# Patient Record
Sex: Male | Born: 2000 | Race: White | Hispanic: No | Marital: Single | Smoking: Never smoker
Health system: Southern US, Community
[De-identification: ages and names within clinical notes are randomized; demographics above are authoritative.]

## PROBLEM LIST (undated history)

## (undated) DIAGNOSIS — F909 Attention-deficit hyperactivity disorder, unspecified type: Secondary | ICD-10-CM

---
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
## 2020-07-07 ENCOUNTER — Emergency Department (HOSPITAL_COMMUNITY)
Admission: EM | Admit: 2020-07-07 | Discharge: 2020-07-07 | Disposition: A | Discharge status: Home / Self Care | Payer: Medicaid Other | Attending: Emergency Medicine | Admitting: Emergency Medicine

## 2020-07-07 ENCOUNTER — Emergency Department (HOSPITAL_COMMUNITY): Payer: Medicaid Other

## 2020-07-07 ENCOUNTER — Encounter (HOSPITAL_COMMUNITY): Payer: Self-pay

## 2020-07-07 ENCOUNTER — Other Ambulatory Visit: Payer: Self-pay

## 2020-07-07 DIAGNOSIS — Y9367 Activity, basketball: Secondary | ICD-10-CM | POA: Diagnosis not present

## 2020-07-07 DIAGNOSIS — S43014A Anterior dislocation of right humerus, initial encounter: Secondary | ICD-10-CM | POA: Insufficient documentation

## 2020-07-07 DIAGNOSIS — Y92838 Other recreation area as the place of occurrence of the external cause: Secondary | ICD-10-CM | POA: Insufficient documentation

## 2020-07-07 DIAGNOSIS — W2105XA Struck by basketball, initial encounter: Secondary | ICD-10-CM | POA: Diagnosis not present

## 2020-07-07 DIAGNOSIS — S4991XA Unspecified injury of right shoulder and upper arm, initial encounter: Secondary | ICD-10-CM | POA: Diagnosis present

## 2020-07-07 DIAGNOSIS — S43004A Unspecified dislocation of right shoulder joint, initial encounter: Secondary | ICD-10-CM

## 2020-07-07 MED ORDER — IBUPROFEN 800 MG PO TABS
800.0000 mg | ORAL_TABLET | Freq: Once | ORAL | Status: AC
  Administered 2020-07-07: 800 mg via ORAL
  Filled 2020-07-07: qty 1

## 2020-07-07 MED ORDER — NAPROXEN 500 MG PO TABS: 500.0000 mg | ORAL_TABLET | Freq: Two times a day (BID) | ORAL | 0 refills | Status: AC

## 2020-07-07 NOTE — Progress Notes (Signed)
Orthopedic Tech Progress Note Patient Details:  Jesus Newton 07/06/2000 [id number]  Ortho Devices Ortho Device/Splint Location: applied shoulder sling to RUE Ortho Device/Splint Interventions: Ordered,Application   Post Interventions Patient Tolerated: Well Instructions Provided: Care of device   Jennye Moccasin 07/07/2020, 9:28 PM

## 2020-07-07 NOTE — Discharge Instructions (Addendum)
Please take Naprosyn, 500mg  by mouth twice daily as needed for pain - this in an antiinflammatory medicine (NSAID) and is similar to ibuprofen - many people feel that it is stronger than ibuprofen and it is easier to take since it is a smaller pill.  Please use this only for 1 week - if your pain persists, you will need to follow up with your doctor in the office for ongoing guidance and pain control.     Your shoulder dislocated and has been put back in place without difficulty - this may happen again in the future so it would be well for you to follow up with the orthopedist of your choice - if you do not have one see the phone number above for Dr. who is on call today.

## 2020-07-07 NOTE — ED Triage Notes (Signed)
Right shoulder pain after dunking a basketball [place]ampoline park. Limited ROM.
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
## 2020-08-06 ENCOUNTER — Ambulatory Visit (HOSPITAL_BASED_OUTPATIENT_CLINIC_OR_DEPARTMENT_OTHER): Admission: RE | Admit: 2020-08-06 | Payer: Medicaid Other | Source: Home / Self Care | Admitting: Orthopedic Surgery

## 2020-08-06 ENCOUNTER — Encounter (HOSPITAL_BASED_OUTPATIENT_CLINIC_OR_DEPARTMENT_OTHER): Admission: RE | Payer: Self-pay | Source: Home / Self Care

## 2020-08-06 SURGERY — SHOULDER ATHROSCOPY WITH CAPSULORRHAPHY
Anesthesia: General | Site: Shoulder | Laterality: Right
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
## 2022-12-23 ENCOUNTER — Ambulatory Visit
Admission: RE | Admit: 2022-12-23 | Discharge: 2022-12-23 | Disposition: A | Discharge status: Home / Self Care | Payer: MEDICAID | Source: Ambulatory Visit | Attending: Internal Medicine | Admitting: Internal Medicine

## 2022-12-23 VITALS — BP 128/63 | HR 62 | Temp 97.9°F | Resp 16

## 2022-12-23 DIAGNOSIS — Z202 Contact with and (suspected) exposure to infections with a predominantly sexual mode of transmission: Secondary | ICD-10-CM | POA: Insufficient documentation

## 2022-12-23 DIAGNOSIS — Z113 Encounter for screening for infections with a predominantly sexual mode of transmission: Secondary | ICD-10-CM | POA: Insufficient documentation

## 2022-12-23 MED ORDER — AZITHROMYCIN 500 MG PO TABS
2000.0000 mg | ORAL_TABLET | Freq: Once | ORAL | Status: AC
  Administered 2022-12-23: 2000 mg via ORAL

## 2022-12-23 MED ORDER — GENTAMICIN SULFATE 40 MG/ML IJ SOLN
240.0000 mg | Freq: Once | INTRAMUSCULAR | Status: AC
  Administered 2022-12-23: 240 mg via INTRAMUSCULAR

## 2022-12-23 NOTE — Discharge Instructions (Signed)
Swab is pending for STD testing.  You were given 2 medications today in urgent care to treat gonorrhea exposure.

## 2022-12-23 NOTE — ED Triage Notes (Signed)
Pt states he is here for STD testing denies any symptoms.

## 2022-12-23 NOTE — ED Provider Notes (Signed)
[name]    CSN: [id number] Arrival date & time: 12/23/22  1847      History   Chief Complaint Chief Complaint  Patient presents with   SEXUALLY TRANSMITTED DISEASE    my girl got tested and she came back wit postive wit gonorrhea - Entered by patient    HPI Jesus Newton is a 22 y.o. male.   Patient presents after gonorrhea exposure from his girlfriend after having unprotected intercourse.  He denies any associated symptoms.     Past Medical History:  Diagnosis Date   ADHD (attention deficit hyperactivity disorder)     There are no problems to display for this patient.   History reviewed. No pertinent surgical history.     Home Medications    Prior to Admission medications   Medication Sig Start Date End Date Taking? Authorizing Provider  azithromycin (ZITHROMAX Z-PAK) 250 MG tablet Take 2 tabs PO once on day 1.  On days 2-5, take 1 tab PO QD. 01/10/18   Lowanda Foster, NP  dexmethylphenidate (FOCALIN XR) 20 MG 24 hr capsule Take 20 mg by mouth 2 (two) times daily.    [provider]  guanFACINE (INTUNIV) 2 MG TB24 SR tablet Take 2 mg by mouth daily.    [provider]  naproxen (NAPROSYN) 500 MG tablet Take 1 tablet (500 mg total) by mouth 2 (two) times daily with a meal. 07/07/20   Eber Hong, MD    Family History History reviewed. No pertinent family history.  Social History Social History   Tobacco Use   Smoking status: Never   Smokeless tobacco: Never  Substance Use Topics   Alcohol use: No   Drug use: No     Allergies   Penicillins   Review of Systems Review of Systems Per HPI  Physical Exam Triage Vital Signs ED Triage Vitals  Encounter Vitals Group     BP 12/23/22 1854 128/63     Systolic BP Percentile --      Diastolic BP Percentile --      Pulse Rate 12/23/22 1853 62     Resp 12/23/22 1853 16     Temp 12/23/22 1853 97.9 F (36.6 C)     Temp Source 12/23/22 1853 Oral     SpO2 --       Weight --      Height --      Head Circumference --      Peak Flow --      Pain Score 12/23/22 1854 0     Pain Loc --      Pain Education --      Exclude from Growth Chart --    No data found.  Updated Vital Signs BP 128/63 (BP Location: Left Arm)   Pulse 62   Temp 97.9 F (36.6 C) (Oral)   Resp 16   Visual Acuity Right Eye Distance:   Left Eye Distance:   Bilateral Distance:    Right Eye Near:   Left Eye Near:    Bilateral Near:     Physical Exam Constitutional:      General: He is not in acute distress.    Appearance: Normal appearance. He is not toxic-appearing or diaphoretic.  HENT:     Head: Normocephalic and atraumatic.  Eyes:     Extraocular Movements: Extraocular movements intact.     Conjunctiva/sclera: Conjunctivae normal.  Pulmonary:     Effort: Pulmonary effort is normal.  Genitourinary:    Comments: Deferred  with shared decision making.  Self swab performed. Neurological:     General: No focal deficit present.     Mental Status: He is alert and oriented to person, place, and time. Mental status is at baseline.  Psychiatric:        Mood and Affect: Mood normal.        Behavior: Behavior normal.        Thought Content: Thought content normal.        Judgment: Judgment normal.      UC Treatments / Results  Labs (all labs ordered are listed, but only abnormal results are displayed) Labs Reviewed  CYTOLOGY, (ORAL, ANAL, URETHRAL) ANCILLARY ONLY    EKG   Radiology No results found.  Procedures Procedures (including critical care time)  Medications Ordered in UC Medications  azithromycin (ZITHROMAX) tablet 2,000 mg (2,000 mg Oral Given 12/23/22 1911)  gentamicin (GARAMYCIN) injection 240 mg (240 mg Intramuscular Given 12/23/22 1911)    Initial Impression / Assessment and Plan / UC Course  I have reviewed the triage vital signs and the nursing notes.  Pertinent labs & imaging results that were available during my care of the patient were  reviewed by me and considered in my medical decision making (see chart for details).     Given exposure to gonorrhea, will prophylactically treat today in urgent care.  Patient is allergic to penicillins and is not sure if he has ever had cephalosporins previously.  Therefore, will treat with alternative with azithromycin 2 g as a single dose and gentamicin IM injection today.  Cytology swab pending.  Will await results for any further treatment.  Advised safe sex practices and refraining from sexual activity until test results and treatment are complete.  Patient verbalized understanding and was agreeable with plan. Final Clinical Impressions(s) / UC Diagnoses   Final diagnoses:  Exposure to gonorrhea  Screening examination for venereal disease     Discharge Instructions      Swab is pending for STD testing.  You were given 2 medications today in urgent care to treat gonorrhea exposure.    ED Prescriptions   None    PDMP not reviewed this encounter.   Gustavus Bryant, [place] 12/23/22 [phone]

## 2022-12-24 ENCOUNTER — Telehealth: Payer: MEDICAID | Admitting: Physician Assistant

## 2022-12-24 DIAGNOSIS — Z202 Contact with and (suspected) exposure to infections with a predominantly sexual mode of transmission: Secondary | ICD-10-CM | POA: Diagnosis not present

## 2022-12-24 NOTE — Progress Notes (Signed)
Virtual Visit Consent   Jesus Newton, you are scheduled for a virtual visit with a [facility] provider today. Just as with appointments in the office, your consent must be obtained to participate. Your consent will be active for this visit and any virtual visit you may have with one of our providers in the next 365 days. If you have a MyChart account, a copy of this consent can be sent to you electronically.  As this is a virtual visit, video technology does not allow for your provider to perform a traditional examination. This may limit your provider's ability to fully assess your condition. If your provider identifies any concerns that need to be evaluated in person or the need to arrange testing (such as labs, EKG, etc.), we will make arrangements to do so. Although advances in technology are sophisticated, we cannot ensure that it will always work on either your end or our end. If the connection with a video visit is poor, the visit may have to be switched to a telephone visit. With either a video or telephone visit, we are not always able to ensure that we have a secure connection.  By engaging in this virtual visit, you consent to the provision of healthcare and authorize for your insurance to be billed (if applicable) for the services provided during this visit. Depending on your insurance coverage, you may receive a charge related to this service.  I need to obtain your verbal consent now. Are you willing to proceed with your visit today? [name] has provided verbal consent on 12/24/2022 for a virtual visit (video or telephone). [name], [place]  Date: 12/24/2022 4:59 PM  Virtual Visit via Video Note   I, [name], connected with  Torris Matsuoka  ([id number], [birth year]) on 12/24/22 at  5:00 PM EDT by a video-enabled telemedicine application and verified that I am speaking with the correct person using two identifiers.  Location: Patient: Virtual Visit Location  Patient: Home Provider: Virtual Visit Location Provider: Home Office   I discussed the limitations of evaluation and management by telemedicine and the availability of in person appointments. The patient expressed understanding and agreed to proceed.    History of Present Illness: Jesus Pittard is a 22 y.o. who identifies as a male who was assigned male at birth, and is being seen today for questions regarding treatment yesterday for gonorrhea exposure. Notes he was evaluated at [facility] and giving exposure was treated with IM Gentamicin and Oral Azithromycin. STI panel still pending. Notes he and his girlfriend who was the exposure, had intercourse this morning after he was treated yesterday but before her treatment that she received this afternoon. Is concerned about re-exposure.     HPI: HPI  Problems: There are no problems to display for this patient.   Allergies:  Allergies  Allergen Reactions   Penicillins Hives   Medications:  Current Outpatient Medications:    azithromycin (ZITHROMAX Z-PAK) 250 MG tablet, Take 2 tabs PO once on day 1.  On days 2-5, take 1 tab PO QD., Disp: 6 each, Rfl: 0   dexmethylphenidate (FOCALIN XR) 20 MG 24 hr capsule, Take 20 mg by mouth 2 (two) times daily., Disp: , Rfl:    guanFACINE (INTUNIV) 2 MG TB24 SR tablet, Take 2 mg by mouth daily., Disp: , Rfl:    naproxen (NAPROSYN) 500 MG tablet, Take 1 tablet (500 mg total) by mouth 2 (two) times daily with a meal., Disp: 30 tablet, Rfl: 0  Observations/Objective: Patient is well-developed, well-nourished in no acute distress.  Resting comfortably  at home.  Head is normocephalic, atraumatic.  No labored breathing.  Speech is clear and coherent with logical content.  Patient is alert and oriented at baseline.    Assessment and Plan: 1. STD exposure  Known gonorrhea exposure via significant other. Was treated yesterday with IM Gentamicin and Azithromycin. Repeat episode of activity today prior to partner  being fully treated. He remains asymptomatic. At this point discussed he needs to refrain from any sexual activity until he and girlfriend have been retested and treated again if necessary. UC to follow-up with him regarding initial labs to make sure no other STI present warranting additional treatments. Follow-up with UC recommendations for timing of retest.  Follow Up Instructions: I discussed the assessment and treatment plan with the patient. The patient was provided an opportunity to ask questions and all were answered. The patient agreed with the plan and demonstrated an understanding of the instructions.  A copy of instructions were sent to the patient via MyChart unless otherwise noted below.   The patient was advised to call back or seek an in-person evaluation if the symptoms worsen or if the condition fails to improve as anticipated.  Time:  I spent 10 minutes with the patient via telehealth technology discussing the above problems/concerns.    [name], PA-C

## 2022-12-24 NOTE — Patient Instructions (Signed)
  Jesus Newton, thank you for joining [name], PA-C for today's virtual visit.  While this provider is not your primary care provider (PCP), if your PCP is located in our provider database this encounter information will be shared with them immediately following your visit.   A [facility] MyChart account gives you access to today's visit and all your visits, tests, and labs performed at [facility] " click here if you don't have a [facility] MyChart account or go to [email or web address]  Consent: (Patient) Jesus Newton provided verbal consent for this virtual visit at the beginning of the encounter.  Current Medications:  Current Outpatient Medications:    azithromycin (ZITHROMAX Z-PAK) 250 MG tablet, Take 2 tabs PO once on day 1.  On days 2-5, take 1 tab PO QD., Disp: 6 each, Rfl: 0   dexmethylphenidate (FOCALIN XR) 20 MG 24 hr capsule, Take 20 mg by mouth 2 (two) times daily., Disp: , Rfl:    guanFACINE (INTUNIV) 2 MG TB24 SR tablet, Take 2 mg by mouth daily., Disp: , Rfl:    naproxen (NAPROSYN) 500 MG tablet, Take 1 tablet (500 mg total) by mouth 2 (two) times daily with a meal., Disp: 30 tablet, Rfl: 0   Medications ordered in this encounter:  No orders of the defined types were placed in this encounter.    *If you need refills on other medications prior to your next appointment, please contact your pharmacy*  Follow-Up: Call back or seek an in-person evaluation if the symptoms worsen or if the condition fails to improve as anticipated.  [facility] [phone]  Other Instructions   If you have been instructed to have an in-person evaluation today at a local Urgent Care facility, please use the link below. It will take you to a list of all of our available [facility], including address, phone number and hours of operation. Please do not delay care.  [facility]  If you or a family member do not have a  primary care provider, use the link below to schedule a visit and establish care. When you choose a [facility] primary care physician or advanced practice provider, you gain a long-term partner in health. Find a Primary Care Provider  Learn more about [facility]'s in-office and virtual care options: [facility] - Get Care Now

## 2022-12-30 ENCOUNTER — Ambulatory Visit: Payer: MEDICAID

## 2022-12-31 ENCOUNTER — Ambulatory Visit: Payer: MEDICAID

## 2023-09-07 ENCOUNTER — Ambulatory Visit
Admission: RE | Admit: 2023-09-07 | Discharge: 2023-09-07 | Disposition: A | Discharge status: Home / Self Care | Payer: MEDICAID | Source: Ambulatory Visit | Attending: Family Medicine | Admitting: Family Medicine

## 2023-09-07 ENCOUNTER — Ambulatory Visit: Payer: MEDICAID

## 2023-09-07 VITALS — BP 156/84 | HR 86 | Temp 98.2°F | Resp 16 | Wt 199.3 lb

## 2023-09-07 DIAGNOSIS — R509 Fever, unspecified: Secondary | ICD-10-CM | POA: Diagnosis not present

## 2023-09-07 DIAGNOSIS — J029 Acute pharyngitis, unspecified: Secondary | ICD-10-CM | POA: Diagnosis not present

## 2023-09-07 LAB — POCT RAPID STREP A (OFFICE): Rapid Strep A Screen: NEGATIVE

## 2023-09-07 MED ORDER — AZITHROMYCIN 250 MG PO TABS: ORAL_TABLET | ORAL | 0 refills | Status: DC

## 2023-09-07 NOTE — ED Triage Notes (Signed)
 Sore throat and been having fever sweating when I sleep throat pain and swollen lymph nodes - Entered by patient  Sxs x 4 days. Pt denies vomiting and cough.

## 2023-09-07 NOTE — ED Provider Notes (Signed)
 [name]    CSN: [id number] Arrival date & time: 09/07/23  1549      History   Chief Complaint Chief Complaint  Patient presents with   Sore Throat    and been having fever sweating when i sleep throat pain and swollen lymph nodes - Entered by patient    HPI Jesus Newton is a 23 y.o. male.    Sore Throat  Patient here today with 4 days of sore throat and fever.  He denies any symptoms of cough, congestion.  Endorses pain with swallowing.  He also has adenopathy along with white spots on his tonsils.  Reports he has been taking Tylenol with minimal relief of symptoms.  Past Medical History:  Diagnosis Date   ADHD (attention deficit hyperactivity disorder)     There are no active problems to display for this patient.   History reviewed. No pertinent surgical history.     Home Medications    Prior to Admission medications   Medication Sig Start Date End Date Taking? Authorizing Provider  azithromycin (ZITHROMAX) 250 MG tablet Take 2 tabs PO x 1 dose, then 1 tab PO QD x 4 days 09/07/23  Yes Buena Carmine, NP  dexmethylphenidate (FOCALIN XR) 20 MG 24 hr capsule Take 20 mg by mouth 2 (two) times daily.    [provider]  guanFACINE (INTUNIV) 2 MG TB24 SR tablet Take 2 mg by mouth daily.    [provider]  naproxen (NAPROSYN) 500 MG tablet Take 1 tablet (500 mg total) by mouth 2 (two) times daily with a meal. 07/07/20   Early Glisson, MD    Family History History reviewed. No pertinent family history.  Social History Social History   Tobacco Use   Smoking status: Never   Smokeless tobacco: Never  Vaping Use   Vaping status: Every Day   Substances: Nicotine, THC  Substance Use Topics   Alcohol use: No   Drug use: No     Allergies   Penicillins   Review of Systems Review of Systems Pertinent negatives listed in HPI   Physical Exam Triage Vital Signs ED Triage Vitals  Encounter Vitals Group     BP 09/07/23  1622 (!) 156/84     Systolic BP Percentile --      Diastolic BP Percentile --      Pulse Rate 09/07/23 1622 86     Resp 09/07/23 1622 16     Temp 09/07/23 1622 98.2 F (36.8 C)     Temp Source 09/07/23 1622 Oral     SpO2 09/07/23 1622 97 %     Weight 09/07/23 1620 199 lb 4.8 oz (90.4 kg)     Height --      Head Circumference --      Peak Flow --      Pain Score 09/07/23 1620 6     Pain Loc --      Pain Education --      Exclude from Growth Chart --    No data found.  Updated Vital Signs BP (!) 156/84 (BP Location: Left Arm)   Pulse 86   Temp 98.2 F (36.8 C) (Oral)   Resp 16   Wt 199 lb 4.8 oz (90.4 kg)   SpO2 97%   BMI 30.30 kg/m   Visual Acuity Right Eye Distance:   Left Eye Distance:   Bilateral Distance:    Right Eye Near:   Left Eye Near:    Bilateral Near:  Physical Exam Vitals reviewed.  Constitutional:      Appearance: He is ill-appearing.  HENT:     Head: Normocephalic and atraumatic.     Mouth/Throat:     Tonsils: Tonsillar exudate present. No tonsillar abscesses. 3+ on the right. 3+ on the left.  Cardiovascular:     Rate and Rhythm: Normal rate and regular rhythm.  Pulmonary:     Effort: Pulmonary effort is normal.     Breath sounds: Normal breath sounds.  Musculoskeletal:     Cervical back: Normal range of motion.  Lymphadenopathy:     Cervical: Cervical adenopathy present.  Skin:    General: Skin is warm.  Neurological:     General: No focal deficit present.     Mental Status: He is alert.     UC Treatments / Results  Labs (all labs ordered are listed, but only abnormal results are displayed) Labs Reviewed  POCT RAPID STREP A (OFFICE) - Normal    EKG   Radiology No results found.  Procedures Procedures (including critical care time)  Medications Ordered in UC Medications - No data to display  Initial Impression / Assessment and Plan / UC Course  I have reviewed the triage vital signs and the nursing  notes.  Pertinent labs & imaging results that were available during my care of the patient were reviewed by me and considered in my medical decision making (see chart for details).    Treating empirically for Bacterial acute pharyngitis on examination findings.   Pharyngitis is likely related to strep however strep test is negative here in clinic. Tylenol and ibuprofen as needed for fever and sore throat.  Well with fluids.  Return if symptoms worsen or do not improve. Final Clinical Impressions(s) / UC Diagnoses   Final diagnoses:  Acute pharyngitis, unspecified etiology  Febrile illness   Discharge Instructions   None    ED Prescriptions     Medication Sig Dispense Auth. Provider   azithromycin (ZITHROMAX) 250 MG tablet Take 2 tabs PO x 1 dose, then 1 tab PO QD x 4 days 6 tablet Buena Carmine, NP      PDMP not reviewed this encounter.   Buena Carmine, NP 09/07/23 1734

## 2024-01-20 ENCOUNTER — Ambulatory Visit: Payer: MEDICAID

## 2024-01-21 ENCOUNTER — Ambulatory Visit
Admission: RE | Admit: 2024-01-21 | Discharge: 2024-01-21 | Disposition: A | Discharge status: Home / Self Care | Payer: MEDICAID | Source: Ambulatory Visit | Attending: Nurse Practitioner | Admitting: Nurse Practitioner

## 2024-01-21 VITALS — BP 132/79 | HR 77 | Temp 98.5°F | Resp 18 | Ht 68.0 in | Wt 199.3 lb

## 2024-01-21 DIAGNOSIS — J029 Acute pharyngitis, unspecified: Secondary | ICD-10-CM | POA: Insufficient documentation

## 2024-01-21 LAB — POCT RAPID STREP A (OFFICE): Rapid Strep A Screen: NEGATIVE

## 2024-01-21 NOTE — Discharge Instructions (Addendum)
 Your symptoms are most likely due to viral pharyngitis, which means inflammation of the throat caused by a virus. This is a very common reason for a sore throat. While pharyngitis can sometimes be caused by bacteria such as strep, your test today was negative, which means your symptoms are not from strep throat. Because this is a viral infection, antibiotics are not needed, as they only work against bacterial infections.  To help with throat discomfort, you can sip warm liquids like broth, tea, or warm water. Cold or frozen drinks and snacks, like ice pops, can also soothe your throat. Gargling with warm salt water three to four times a day may provide relief, and sucking on hard candy or throat lozenges can help as well. Using a cool-mist humidifier at night can keep the air moist and make breathing easier. Tylenol  or ibuprofen  can be taken if you have any pain or fever.  Be sure to drink plenty of fluids to stay well hydrated. Once you have finished your medication and your symptoms have improved, remember to replace your toothbrush to help prevent re-infection. If your symptoms get worse or do not improve within a few days, follow up with your healthcare provider.

## 2024-01-21 NOTE — ED Provider Notes (Signed)
 [name]    CSN: [id number] Arrival date & time: 01/21/24  1054      History   Chief Complaint Chief Complaint  Patient presents with   Sore Throat    Entered by patient    HPI Jesus Newton is a 23 y.o. male.   Discussed the use of AI scribe software for clinical note transcription with the patient, who gave verbal consent to proceed.    The patient with a chief complaint of sore throat that started on Tuesday. The sore throat has worsened since onset, with the patient describing it as hurting worse and probably just a little bit more. The patient reports associated symptoms of body aches and intermittent chills, which they describe as nothing that serious. They deny fever, runny nose, congestion, sneezing, cough, nausea, vomiting, diarrhea, or headaches. The patient has not taken any medication for their symptoms, relying only on drinking tea for relief. They report that the tea helps here and there, but not really. When asked if the sore throat feels like swallowing glass, the patient denies this sensation.  The patient denies being around anyone sick that they know of. They have not sought any other medical care for this issue prior to this visit.  The following portions of the patient's history were reviewed and updated as appropriate: allergies, current medications, past family history, past medical history, past social history, past surgical history, and problem list.    Past Medical History:  Diagnosis Date   ADHD (attention deficit hyperactivity disorder)     There are no active problems to display for this patient.   History reviewed. No pertinent surgical history.     Home Medications    Prior to Admission medications   Medication Sig Start Date End Date Taking? Authorizing Provider  naproxen  (NAPROSYN ) 500 MG tablet Take 1 tablet (500 mg total) by mouth 2 (two) times daily with a meal. 07/07/20   Cleotilde Rogue, MD    Family  History History reviewed. No pertinent family history.  Social History Social History   Tobacco Use   Smoking status: Never   Smokeless tobacco: Never  Vaping Use   Vaping status: Every Day   Substances: Nicotine, THC, Flavoring  Substance Use Topics   Alcohol use: No   Drug use: No     Allergies   Penicillins   Review of Systems Review of Systems  Constitutional:  Positive for chills (mild and intermittent). Negative for fever.  HENT:  Positive for sore throat. Negative for congestion, rhinorrhea and sneezing.   Respiratory:  Negative for cough.   Gastrointestinal:  Negative for abdominal distention, diarrhea, nausea and vomiting.  Musculoskeletal:  Positive for myalgias.  Neurological:  Negative for headaches.  All other systems reviewed and are negative.    Physical Exam Triage Vital Signs ED Triage Vitals  Encounter Vitals Group     BP 01/21/24 1136 132/79     Girls Systolic BP Percentile --      Girls Diastolic BP Percentile --      Boys Systolic BP Percentile --      Boys Diastolic BP Percentile --      Pulse Rate 01/21/24 1136 77     Resp 01/21/24 1136 18     Temp 01/21/24 1136 98.5 F (36.9 C)     Temp Source 01/21/24 1136 Oral     SpO2 01/21/24 1136 96 %     Weight 01/21/24 1134 199 lb 4.7 oz (90.4 kg)  Height 01/21/24 1134 5' 8 (1.727 m)     Head Circumference --      Peak Flow --      Pain Score 01/21/24 1132 7     Pain Loc --      Pain Education --      Exclude from Growth Chart --    No data found.  Updated Vital Signs BP 132/79 (BP Location: Left Arm)   Pulse 77   Temp 98.5 F (36.9 C) (Oral)   Resp 18   Ht 5' 8 (1.727 m)   Wt 199 lb 4.7 oz (90.4 kg)   SpO2 96%   BMI 30.30 kg/m   Visual Acuity Right Eye Distance:   Left Eye Distance:   Bilateral Distance:    Right Eye Near:   Left Eye Near:    Bilateral Near:     Physical Exam Vitals reviewed.  Constitutional:      General: He is awake. He is not in acute  distress.    Appearance: Normal appearance. He is well-developed. He is not ill-appearing, toxic-appearing or diaphoretic.  HENT:     Head: Normocephalic.     Right Ear: Tympanic membrane, ear canal and external ear normal. No drainage, swelling or tenderness. No middle ear effusion. Tympanic membrane is not erythematous.     Left Ear: Tympanic membrane, ear canal and external ear normal. No drainage, swelling or tenderness.  No middle ear effusion. Tympanic membrane is not erythematous.     Nose: Nose normal. No congestion or rhinorrhea.     Mouth/Throat:     Lips: Pink.     Mouth: Mucous membranes are moist.     Pharynx: Uvula midline. Pharyngeal swelling (Mild) and posterior oropharyngeal erythema (Mild) present. No oropharyngeal exudate or uvula swelling.     Tonsils: No tonsillar exudate or tonsillar abscesses.  Eyes:     General: Vision grossly intact.     Conjunctiva/sclera: Conjunctivae normal.  Cardiovascular:     Rate and Rhythm: Normal rate.     Heart sounds: Normal heart sounds.  Pulmonary:     Effort: Pulmonary effort is normal. No tachypnea or respiratory distress.     Breath sounds: Normal breath sounds and air entry.  Musculoskeletal:        General: Normal range of motion.     Cervical back: Normal range of motion and neck supple.  Lymphadenopathy:     Cervical: Cervical adenopathy (non-tender) present.  Skin:    General: Skin is warm and dry.  Neurological:     General: No focal deficit present.     Mental Status: He is alert and oriented to person, place, and time.  Psychiatric:        Behavior: Behavior is cooperative.      UC Treatments / Results  Labs (all labs ordered are listed, but only abnormal results are displayed) Labs Reviewed  CULTURE, GROUP A STREP [facility])  POCT RAPID STREP A (OFFICE)    EKG   Radiology No results found.  Procedures Procedures (including critical care time)  Medications Ordered in UC Medications - No data to  display  Initial Impression / Assessment and Plan / UC Course  I have reviewed the triage vital signs and the nursing notes.  Pertinent labs & imaging results that were available during my care of the patient were reviewed by me and considered in my medical decision making (see chart for details).     Patient presents with sore throat consistent with pharyngitis.  Rapid strep test was negative, and throat culture has been sent for confirmation. Clinical presentation suggests viral etiology at this time. Patient was advised that they will only be contacted if the culture returns positive and antibiotics are needed. Otherwise, they may review results through MyChart or follow up with their primary care provider as needed. Supportive care was discussed, and patient was instructed to return if symptoms worsen or new concerns arise.  Today's evaluation has revealed no signs of a dangerous process. Discussed diagnosis with patient and/or guardian. Patient and/or guardian aware of their diagnosis, possible red flag symptoms to watch out for and need for close follow up. Patient and/or guardian understands verbal and written discharge instructions. Patient and/or guardian comfortable with plan and disposition.  Patient and/or guardian has a clear mental status at this time, good insight into illness (after discussion and teaching) and has clear judgment to make decisions regarding their care  Documentation was completed with the aid of voice recognition software. Transcription may contain typographical errors. Final Clinical Impressions(s) / UC Diagnoses   Final diagnoses:  Acute sore throat  Acute viral pharyngitis     Discharge Instructions      Your symptoms are most likely due to viral pharyngitis, which means inflammation of the throat caused by a virus. This is a very common reason for a sore throat. While pharyngitis can sometimes be caused by bacteria such as strep, your test today was  negative, which means your symptoms are not from strep throat. Because this is a viral infection, antibiotics are not needed, as they only work against bacterial infections.  To help with throat discomfort, you can sip warm liquids like broth, tea, or warm water. Cold or frozen drinks and snacks, like ice pops, can also soothe your throat. Gargling with warm salt water three to four times a day may provide relief, and sucking on hard candy or throat lozenges can help as well. Using a cool-mist humidifier at night can keep the air moist and make breathing easier. Tylenol  or ibuprofen  can be taken if you have any pain or fever.  Be sure to drink plenty of fluids to stay well hydrated. Once you have finished your medication and your symptoms have improved, remember to replace your toothbrush to help prevent re-infection. If your symptoms get worse or do not improve within a few days, follow up with your healthcare provider.       ED Prescriptions   None    PDMP not reviewed this encounter.   Iola Lukes, [place] 01/21/24 [phone]

## 2024-01-21 NOTE — ED Triage Notes (Signed)
 I think I have strep throat, it started Tuesday and now much worse. No fever known. No runny nose. No cough. No new/unexplained rash. I am also having body aches today.

## 2024-01-22 ENCOUNTER — Ambulatory Visit: Payer: Self-pay | Admitting: Nurse Practitioner

## 2024-01-23 LAB — CULTURE, GROUP A STREP (THRC)
